# Patient Record
Sex: Female | Born: 2000 | Race: White | Hispanic: No | Marital: Single | Smoking: Never smoker
Health system: Southern US, Community
[De-identification: ages and names within clinical notes are randomized; demographics above are authoritative.]

## PROBLEM LIST (undated history)

## (undated) DIAGNOSIS — J45909 Unspecified asthma, uncomplicated: Secondary | ICD-10-CM

---
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
## 2022-02-21 ENCOUNTER — Ambulatory Visit (INDEPENDENT_AMBULATORY_CARE_PROVIDER_SITE_OTHER): Payer: Medicaid Other | Admitting: Nurse Practitioner

## 2022-02-21 ENCOUNTER — Encounter: Payer: Self-pay | Admitting: Nurse Practitioner

## 2022-02-21 VITALS — BP 127/74 | HR 73 | Temp 97.9°F | Ht 61.0 in | Wt 126.1 lb

## 2022-02-21 DIAGNOSIS — J4520 Mild intermittent asthma, uncomplicated: Secondary | ICD-10-CM | POA: Diagnosis not present

## 2022-02-21 DIAGNOSIS — Z7689 Persons encountering health services in other specified circumstances: Secondary | ICD-10-CM | POA: Diagnosis not present

## 2022-02-21 MED ORDER — QVAR REDIHALER 40 MCG/ACT IN AERB: 2.0000 | INHALATION_SPRAY | Freq: Two times a day (BID) | RESPIRATORY_TRACT | 5 refills | Status: DC

## 2022-02-21 MED ORDER — ALBUTEROL SULFATE HFA 108 (90 BASE) MCG/ACT IN AERS: 2.0000 | INHALATION_SPRAY | Freq: Four times a day (QID) | RESPIRATORY_TRACT | 2 refills | Status: DC | PRN

## 2022-02-21 NOTE — Progress Notes (Signed)
New Patient Office Visit  Subjective    Patient ID: Lisa Calhoun, female    DOB: 2001/05/30  Age: 21 y.o. MRN: [id number]  CC:  Chief Complaint  Patient presents with   New Patient (Initial Visit)    HPI Lisa Calhoun presents to establish care She is coming from her pediatrician's office.  -history of asthma. Uses Qvar twice daily and a rescue inhaler as needed.  -needs both maintenance and rescue inhalers refilled. States that it has been years since using her rescue inhaler.  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  -she denies problems or concerns today.  -had been treated for ADD in the past. Has not taken medication for this since she was in middle school.   Outpatient Encounter Medications as of 02/21/2022  Medication Sig   albuterol (VENTOLIN HFA) 108 (90 Base) MCG/ACT inhaler Inhale 2 puffs into the lungs every 6 (six) hours as needed for wheezing or shortness of breath.   beclomethasone (QVAR REDIHALER) 40 MCG/ACT inhaler Inhale 2 puffs into the lungs 2 (two) times daily.   [DISCONTINUED] albuterol (PROVENTIL HFA;VENTOLIN HFA) 108 (90 BASE) MCG/ACT inhaler Inhale 2 puffs into the lungs every 6 (six) hours as needed. For shortness of breath   [DISCONTINUED] atomoxetine (STRATTERA) 18 MG capsule Take 18 mg by mouth daily.   [DISCONTINUED] beclomethasone (QVAR) 40 MCG/ACT inhaler Inhale 2 puffs into the lungs 2 (two) times daily.   [DISCONTINUED] clindamycin (CLEOCIN) 75 MG/5ML solution Take 9 mg by mouth every 12 (twelve) hours.   [DISCONTINUED] dexmethylphenidate (FOCALIN XR) 20 MG 24 hr capsule Take 20 mg by mouth every morning.   [DISCONTINUED] dexmethylphenidate (FOCALIN) 5 MG tablet Take 5 mg by mouth daily. Take after school.   No facility-administered encounter medications on file as of 02/21/2022.    Past Medical History:  Diagnosis Date   Asthma    Attention deficit disorder     History reviewed. No  pertinent surgical history.  Family History  Problem Relation Age of Onset   Diabetes Maternal Grandmother    Diabetes Maternal Grandfather     Social History   Socioeconomic History   Marital status: Single    Spouse name: Not on file   Number of children: Not on file   Years of education: Not on file   Highest education level: Not on file  Occupational History   Not on file  Tobacco Use   Smoking status: Never   Smokeless tobacco: Not on file  Vaping Use   Vaping Use: Not on file  Substance and Sexual Activity   Alcohol use: No   Drug use: Never   Sexual activity: Yes  Other Topics Concern   Not on file  Social History Narrative   Not on file   Social Determinants of Health   Financial Resource Strain: Not on file  Food Insecurity: Not on file  Transportation Needs: Not on file  Physical Activity: Not on file  Stress: Not on file  Social Connections: Not on file  Intimate Partner Violence: Not on file    Review of Systems  Constitutional:  Negative for chills, fever and malaise/fatigue.  HENT:  Negative for congestion, sinus pain and sore throat.   Eyes: Negative.   Respiratory:  Negative for cough, shortness of breath and wheezing.        Well controlled asthma.   Cardiovascular:  Negative for chest pain, palpitations and leg swelling.  Gastrointestinal:  Negative  for constipation, diarrhea, nausea and vomiting.  Genitourinary: Negative.   Musculoskeletal:  Negative for myalgias.  Skin: Negative.   Neurological:  Negative for dizziness and headaches.  Endo/Heme/Allergies:  Does not bruise/bleed easily.  Psychiatric/Behavioral:  Negative for depression. The patient is not nervous/anxious.        Objective    Today's Vitals   02/21/22 1500  BP: 127/74  Pulse: 73  Temp: 97.9 F (36.6 C)  SpO2: 98%  Weight: 126 lb 1.9 oz (57.2 kg)  Height: 5\' 1"  (1.549 m)   Body mass index is 23.83 kg/m.   Physical Exam Vitals and nursing note reviewed.   Constitutional:      Appearance: Normal appearance. She is well-developed.  HENT:     Head: Normocephalic.  Eyes:     Pupils: Pupils are equal, round, and reactive to light.  Cardiovascular:     Rate and Rhythm: Normal rate and regular rhythm.     Pulses: Normal pulses.     Heart sounds: Normal heart sounds.  Pulmonary:     Effort: Pulmonary effort is normal.     Breath sounds: Normal breath sounds.  Abdominal:     Palpations: Abdomen is soft.  Musculoskeletal:        General: Normal range of motion.     Cervical back: Normal range of motion and neck supple.  Lymphadenopathy:     Cervical: No cervical adenopathy.  Skin:    General: Skin is warm and dry.     Capillary Refill: Capillary refill takes less than 2 seconds.  Neurological:     General: No focal deficit present.     Mental Status: She is alert and oriented to person, place, and time.  Psychiatric:        Mood and Affect: Mood normal.        Behavior: Behavior normal.        Thought Content: Thought content normal.        Judgment: Judgment normal.       Assessment & Plan:  1. Mild intermittent asthma without complication Continue Qvar twice daily. Use rescue inhaler as needed and as prescribed. New prescriptions were sent to her pharmacy today.  - albuterol (VENTOLIN HFA) 108 (90 Base) MCG/ACT inhaler; Inhale 2 puffs into the lungs every 6 (six) hours as needed for wheezing or shortness of breath.  Dispense: 1 each; Refill: 2 - beclomethasone (QVAR REDIHALER) 40 MCG/ACT inhaler; Inhale 2 puffs into the lungs 2 (two) times daily.  Dispense: 1 each; Refill: 5  2. Encounter to establish care Appointment today to establish new primary care provider      Problem List Items Addressed This Visit       Respiratory   Mild intermittent asthma without complication - Primary   Relevant Medications   albuterol (VENTOLIN HFA) 108 (90 Base) MCG/ACT inhaler   beclomethasone (QVAR REDIHALER) 40 MCG/ACT inhaler   Other  Visit Diagnoses     Encounter to establish care           Return in about 6 months (around 08/23/2022) for health maintenance exam.   14/09/2021, NP

## 2022-02-24 DIAGNOSIS — J4520 Mild intermittent asthma, uncomplicated: Secondary | ICD-10-CM | POA: Insufficient documentation

## 2022-05-02 ENCOUNTER — Other Ambulatory Visit: Payer: Self-pay | Admitting: Nurse Practitioner

## 2022-05-02 DIAGNOSIS — J4520 Mild intermittent asthma, uncomplicated: Secondary | ICD-10-CM

## 2022-08-21 ENCOUNTER — Other Ambulatory Visit: Payer: Self-pay | Admitting: Nurse Practitioner

## 2022-08-21 DIAGNOSIS — J4520 Mild intermittent asthma, uncomplicated: Secondary | ICD-10-CM

## 2022-08-21 MED ORDER — FLUTICASONE PROPIONATE HFA 44 MCG/ACT IN AERO: 2.0000 | INHALATION_SPRAY | Freq: Two times a day (BID) | RESPIRATORY_TRACT | 11 refills | Status: DC

## 2022-08-23 ENCOUNTER — Ambulatory Visit (INDEPENDENT_AMBULATORY_CARE_PROVIDER_SITE_OTHER): Payer: Medicaid Other | Admitting: Nurse Practitioner

## 2022-08-23 ENCOUNTER — Encounter: Payer: Self-pay | Admitting: Nurse Practitioner

## 2022-08-23 VITALS — BP 112/80 | HR 65 | Resp 20 | Ht 61.0 in | Wt 135.0 lb

## 2022-08-23 DIAGNOSIS — Z0000 Encounter for general adult medical examination without abnormal findings: Secondary | ICD-10-CM

## 2022-08-23 DIAGNOSIS — J4520 Mild intermittent asthma, uncomplicated: Secondary | ICD-10-CM

## 2022-08-23 DIAGNOSIS — L309 Dermatitis, unspecified: Secondary | ICD-10-CM | POA: Diagnosis not present

## 2022-08-23 MED ORDER — EUCRISA 2 % EX OINT: TOPICAL_OINTMENT | CUTANEOUS | 3 refills | Status: DC

## 2022-08-23 MED ORDER — FLUTICASONE PROPIONATE HFA 44 MCG/ACT IN AERO: 2.0000 | INHALATION_SPRAY | Freq: Two times a day (BID) | RESPIRATORY_TRACT | 11 refills | Status: DC

## 2022-08-23 MED ORDER — ALBUTEROL SULFATE HFA 108 (90 BASE) MCG/ACT IN AERS: 2.0000 | INHALATION_SPRAY | Freq: Four times a day (QID) | RESPIRATORY_TRACT | 3 refills | Status: DC | PRN

## 2022-08-23 MED ORDER — MONTELUKAST SODIUM 10 MG PO TABS: 10.0000 mg | ORAL_TABLET | Freq: Every day | ORAL | 1 refills | Status: DC

## 2022-08-23 NOTE — Progress Notes (Signed)
Complete physical exam   Patient: Lisa Calhoun   DOB: [birth year]   21 y.o. Female  MRN: [id number] Visit Date: 08/23/2022    Chief Complaint  Patient presents with   Annual Exam    Non Fasting    Subjective    Lisa Calhoun is a 21 y.o. female who presents today for a complete physical exam.  She reports consuming a  generally healthy  diet.  She generally feels well. She does not have additional problems to discuss today.  HPI HPI     Annual Exam    Additional comments: Non Fasting       Last edited by Gemma Payor, CMA on 08/23/2022 10:19 AM.        Past Medical History:  Diagnosis Date   Asthma    Attention deficit disorder    History reviewed. No pertinent surgical history. Social History   Socioeconomic History   Marital status: Single    Spouse name: Not on file   Number of children: Not on file   Years of education: Not on file   Highest education level: Not on file  Occupational History   Not on file  Tobacco Use   Smoking status: Never   Smokeless tobacco: Not on file  Vaping Use   Vaping Use: Not on file  Substance and Sexual Activity   Alcohol use: No   Drug use: Never   Sexual activity: Yes  Other Topics Concern   Not on file  Social History Narrative   Not on file   Social Determinants of Health   Financial Resource Strain: Not on file  Food Insecurity: Not on file  Transportation Needs: Not on file  Physical Activity: Not on file  Stress: Not on file  Social Connections: Not on file  Intimate Partner Violence: Not on file   Family Status  Relation Name Status   MGM  (Not Specified)   MGF  (Not Specified)   Family History  Problem Relation Age of Onset   Diabetes Maternal Grandmother    Diabetes Maternal Grandfather    Allergies  Allergen Reactions   Sulfamethoxazole-Trimethoprim Itching    Patient Care Team: Ronnell Freshwater, NP as PCP - General (Family Medicine)   Medications: Outpatient Medications Prior  to Visit  Medication Sig   [DISCONTINUED] fluticasone (FLOVENT HFA) 44 MCG/ACT inhaler Inhale 2 puffs into the lungs 2 (two) times daily.   [DISCONTINUED] VENTOLIN HFA 108 (90 Base) MCG/ACT inhaler TAKE 2 PUFFS BY MOUTH EVERY 6 HOURS AS NEEDED FOR WHEEZE OR SHORTNESS OF BREATH   No facility-administered medications prior to visit.    Review of Systems  Constitutional:  Negative for activity change, appetite change, chills, fatigue and fever.  HENT:  Negative for congestion, postnasal drip, rhinorrhea, sinus pressure, sinus pain, sneezing and sore throat.   Eyes: Negative.   Respiratory:  Negative for cough, chest tightness, shortness of breath and wheezing.   Cardiovascular:  Negative for chest pain and palpitations.  Gastrointestinal:  Negative for abdominal pain, constipation, diarrhea, nausea and vomiting.  Endocrine: Negative for cold intolerance, heat intolerance, polydipsia and polyuria.  Genitourinary:  Negative for dyspareunia, dysuria, flank pain, frequency and urgency.  Musculoskeletal:  Negative for arthralgias, back pain and myalgias.  Skin:  Negative for rash.  Allergic/Immunologic: Positive for environmental allergies.  Neurological:  Negative for dizziness, weakness and headaches.  Hematological:  Negative for adenopathy.  Psychiatric/Behavioral:  The patient is not nervous/anxious.       Objective  Today's Vitals   08/23/22 1020  BP: 112/80  Pulse: 65  Resp: 20  SpO2: 95%  Weight: 135 lb (61.2 kg)  Height: _0  (1.549 m)   Body mass index is 25.51 kg/m.  BP Readings from Last 3 Encounters:  08/23/22 112/80  02/21/22 127/74  03/20/15 94/74    Wt Readings from Last 3 Encounters:  08/23/22 135 lb (61.2 kg) (62 %, Z= 0.31)*  02/21/22 126 lb 1.9 oz (57.2 kg) (49 %, Z= -0.03)*  03/20/15 89 lb 6.4 oz (40.6 kg) (40 %, Z= -0.26)*   * Growth percentiles are based on CDC (Girls, 2-20 Years) data.     Physical Exam Vitals and nursing note reviewed.   Constitutional:      Appearance: Normal appearance. She is well-developed.  HENT:     Head: Normocephalic and atraumatic.     Right Ear: Tympanic membrane, ear canal and external ear normal.     Left Ear: Tympanic membrane, ear canal and external ear normal.     Nose: Nose normal.     Mouth/Throat:     Mouth: Mucous membranes are moist.     Pharynx: Oropharynx is clear.  Eyes:     Extraocular Movements: Extraocular movements intact.     Conjunctiva/sclera: Conjunctivae normal.     Pupils: Pupils are equal, round, and reactive to light.  Cardiovascular:     Rate and Rhythm: Normal rate and regular rhythm.     Pulses: Normal pulses.     Heart sounds: Normal heart sounds.  Pulmonary:     Effort: Pulmonary effort is normal.     Breath sounds: Normal breath sounds.  Abdominal:     General: Bowel sounds are normal. There is no distension.     Palpations: Abdomen is soft. There is no mass.     Tenderness: There is no abdominal tenderness. There is no right CVA tenderness, left CVA tenderness, guarding or rebound.     Hernia: No hernia is present.  Musculoskeletal:        General: Normal range of motion.     Cervical back: Normal range of motion and neck supple.  Lymphadenopathy:     Cervical: No cervical adenopathy.  Skin:    General: Skin is warm and dry.     Capillary Refill: Capillary refill takes less than 2 seconds.  Neurological:     General: No focal deficit present.     Mental Status: She is alert and oriented to person, place, and time.  Psychiatric:        Mood and Affect: Mood normal.        Behavior: Behavior normal.        Thought Content: Thought content normal.        Judgment: Judgment normal.      Last depression screening scores   Row Labels 08/23/2022   10:37 AM 02/21/2022    3:32 PM  PHQ 2/9 Scores   Section Header. No data exists in this row.    PHQ - 2 Score   4 0  PHQ- 9 Score   10 3   Last fall risk screening   Row Labels 08/23/2022   10:38 AM   Fall Risk    Section Header. No data exists in this row.   Falls in the past year?   0  Number falls in past yr:   0  Injury with Fall?   0        Assessment & Plan  1. Routine general medical examination at a health care facility Annual physical today   2. Mild intermittent asthma without complication Continue maintenance inhaler and singulair every day. Use rescue inhaler as needed and as prescribed. Refills provided for all today  - fluticasone (FLOVENT HFA) 44 MCG/ACT inhaler; Inhale 2 puffs into the lungs 2 (two) times daily.  Dispense: 1 each; Refill: 11 - albuterol (VENTOLIN HFA) 108 (90 Base) MCG/ACT inhaler; Inhale 2 puffs into the lungs every 6 (six) hours as needed for wheezing or shortness of breath.  Dispense: 18 each; Refill: 3 - montelukast (SINGULAIR) 10 MG tablet; Take 1 tablet (10 mg total) by mouth at bedtime.  Dispense: 90 tablet; Refill: 1  3. Eczema of face Trial Eucrisa ointment - use on effected areas of the  face and body as needed and as prescribed  - Crisaborole (EUCRISA) 2 % OINT; Apply small amount to effected areas twice daily as needed  Dispense: 60 g; Refill: 3    Immunization History  Administered Date(s) Administered   DTaP 03/03/2003, 05/04/2003, 06/29/2003, 08/03/2004, 02/05/2007   HIB (PRP-T) 03/03/2003, 05/04/2003, 06/29/2003, 12/22/2003   HPV 9-valent 03/17/2017   HPV Quadrivalent 12/12/2014   Hepatitis A, Ped/Adol-2 Dose 02/03/2006, 02/05/2007   Hepatitis B, PED/ADOLESCENT 12/23/2002, 01/25/2003, 09/26/2003   IPV 03/03/2003, 05/04/2003, 06/29/2003, 02/05/2007   Influenza Split 07/28/2015   Influenza,inj,Quad PF,6+ Mos 08/08/2016, 08/13/2021   MMR 12/22/2003, 02/05/2007   Meningococcal Conjugate 09/21/2014   Meningococcal Mcv4,unspecified 08/13/2021   Pneumococcal Conjugate PCV 7 03/03/2003, 05/04/2003, 06/29/2003, 08/03/2004   Tdap 04/26/2013   Varicella 12/22/2003, 02/05/2007    Health Maintenance  Topic Date Due   CHLAMYDIA  SCREENING  Never done   HIV Screening  Never done   Hepatitis C Screening  Never done   INFLUENZA VACCINE  04/23/2022   DTaP/Tdap/Td (7 - Td or Tdap) 04/27/2023   HPV VACCINES  Completed    Discussed health benefits of physical activity, and encouraged her to engage in regular exercise appropriate for her age and condition.  Problem List Items Addressed This Visit       Respiratory   Mild intermittent asthma without complication   Relevant Medications   fluticasone (FLOVENT HFA) 44 MCG/ACT inhaler   albuterol (VENTOLIN HFA) 108 (90 Base) MCG/ACT inhaler   montelukast (SINGULAIR) 10 MG tablet     Musculoskeletal and Integument   Eczema of face   Relevant Medications   Crisaborole (EUCRISA) 2 % OINT   Other Visit Diagnoses     Routine general medical examination at a health care facility    -  Primary        Return in about 3 months (around 11/22/2022) for asthma.        Ronnell Freshwater, NP  [facility] at [facility] [phone] (phone) [phone] (fax)  [place]

## 2022-08-26 ENCOUNTER — Telehealth: Payer: Self-pay

## 2022-08-26 NOTE — Telephone Encounter (Signed)
Eucrisa 2% ointment was approved by [employer] from 08/26/2022 to 08/26/23. Pharmacy has been notified.

## 2022-09-14 DIAGNOSIS — L309 Dermatitis, unspecified: Secondary | ICD-10-CM | POA: Insufficient documentation

## 2022-11-24 NOTE — Progress Notes (Unsigned)
Established patient visit   Patient: Lisa Calhoun   DOB: 2000-11-16   22 y.o. Female  MRN: [id number] Visit Date: 11/25/2022   No chief complaint on file.  Subjective    HPI  Follow up  -asthma   --currently on flovent twice daily, singulair daily, and albuterol rescue  inhaler  Medications: Outpatient Medications Prior to Visit  Medication Sig   albuterol (VENTOLIN HFA) 108 (90 Base) MCG/ACT inhaler Inhale 2 puffs into the lungs every 6 (six) hours as needed for wheezing or shortness of breath.   Crisaborole (EUCRISA) 2 % OINT Apply small amount to effected areas twice daily as needed   fluticasone (FLOVENT HFA) 44 MCG/ACT inhaler Inhale 2 puffs into the lungs 2 (two) times daily.   montelukast (SINGULAIR) 10 MG tablet Take 1 tablet (10 mg total) by mouth at bedtime.   No facility-administered medications prior to visit.    Review of Systems  {Labs (Optional):23779}   Objective    There were no vitals filed for this visit. There is no height or weight on file to calculate BMI.  BP Readings from Last 3 Encounters:  08/23/22 112/80  02/21/22 127/74  03/20/15 94/74    Wt Readings from Last 3 Encounters:  08/23/22 135 lb (61.2 kg) (62 %, Z= 0.31)*  02/21/22 126 lb 1.9 oz (57.2 kg) (49 %, Z= -0.03)*  03/20/15 89 lb 6.4 oz (40.6 kg) (40 %, Z= -0.26)*   * Growth percentiles are based on CDC (Girls, 2-20 Years) data.    Physical Exam  ***  No results found for any visits on 11/25/22.  Assessment & Plan     Problem List Items Addressed This Visit   None    No follow-ups on file.         Ronnell Freshwater, NP  [facility] at [facility] [phone] (phone) [phone] (fax)  [place]

## 2022-11-25 ENCOUNTER — Encounter: Payer: Self-pay | Admitting: Nurse Practitioner

## 2022-11-25 ENCOUNTER — Ambulatory Visit (INDEPENDENT_AMBULATORY_CARE_PROVIDER_SITE_OTHER): Payer: Medicaid Other | Admitting: Nurse Practitioner

## 2022-11-25 VITALS — BP 99/65 | HR 94 | Ht 61.1 in | Wt 137.0 lb

## 2022-11-25 DIAGNOSIS — J4520 Mild intermittent asthma, uncomplicated: Secondary | ICD-10-CM

## 2022-11-25 DIAGNOSIS — R41840 Attention and concentration deficit: Secondary | ICD-10-CM

## 2022-11-25 DIAGNOSIS — F902 Attention-deficit hyperactivity disorder, combined type: Secondary | ICD-10-CM | POA: Insufficient documentation

## 2022-11-25 MED ORDER — DEXMETHYLPHENIDATE HCL ER 10 MG PO CP24: 10.0000 mg | ORAL_CAPSULE | Freq: Every day | ORAL | 0 refills | Status: DC | PRN

## 2022-12-25 ENCOUNTER — Encounter: Payer: Self-pay | Admitting: Nurse Practitioner

## 2022-12-25 ENCOUNTER — Ambulatory Visit (INDEPENDENT_AMBULATORY_CARE_PROVIDER_SITE_OTHER): Payer: Medicaid Other | Admitting: Nurse Practitioner

## 2022-12-25 VITALS — BP 98/64 | HR 81 | Ht 62.0 in | Wt 136.8 lb

## 2022-12-25 DIAGNOSIS — J4520 Mild intermittent asthma, uncomplicated: Secondary | ICD-10-CM

## 2022-12-25 DIAGNOSIS — R41840 Attention and concentration deficit: Secondary | ICD-10-CM | POA: Diagnosis not present

## 2022-12-25 MED ORDER — DEXMETHYLPHENIDATE HCL ER 10 MG PO CP24: 10.0000 mg | ORAL_CAPSULE | Freq: Every day | ORAL | 0 refills | Status: DC | PRN

## 2022-12-25 MED ORDER — PRO COMFORT SPACER ADULT MISC: 5 refills | Status: DC

## 2022-12-25 NOTE — Progress Notes (Signed)
Established patient visit   Patient: Lisa Calhoun   DOB: 11/05/2000   22 y.o. 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   Asthma is doing better  -using flovent twice daily, albuterol rescue inhaler, and singulair.  -using nebulizer saline treatments daily. Feels like she is doing better since the addition of nebulizer  Would like to have spacer for her inhaler.   -She denies chest pain, chest pressure, or shortness of breath. She denies headaches or visual disturbances. She denies abdominal pain, nausea, vomiting, or changes in bowel or bladder habits.     Medications: Outpatient Medications Prior to Visit  Medication Sig   Crisaborole (EUCRISA) 2 % OINT Apply small amount to effected areas twice daily as needed   montelukast (SINGULAIR) 10 MG tablet Take 1 tablet (10 mg total) by mouth at bedtime.   [DISCONTINUED] albuterol (VENTOLIN HFA) 108 (90 Base) MCG/ACT inhaler Inhale 2 puffs into the lungs every 6 (six) hours as needed for wheezing or shortness of breath.   [DISCONTINUED] dexmethylphenidate (FOCALIN XR) 10 MG 24 hr capsule Take 1 capsule (10 mg total) by mouth daily as needed.   fluticasone (FLOVENT HFA) 44 MCG/ACT inhaler Inhale 2 puffs into the lungs 2 (two) times daily.   No facility-administered medications prior to visit.    Review of Systems See HPI      Objective     Today's Vitals   12/25/22 1435  BP: 98/64  Pulse: 81  SpO2: 97%  Weight: 136 lb 12 oz (62 kg)  Height: 5\' 2"  (1.575 m)  PainSc: 3   PainLoc: Throat   Body mass index is 25.01 kg/m.  BP Readings from Last 3 Encounters:  12/25/22 98/64  11/25/22 99/65   08/23/22 112/80    Wt Readings from Last 3 Encounters:  12/25/22 136 lb 12 oz (62 kg)  11/25/22 137 lb (62.1 kg) (65 %, Z= 0.38)*  08/23/22 135 lb (61.2 kg) (62 %, Z= 0.31)*   * Growth percentiles are based on CDC (Girls, 2-20 Years) data.    Physical Exam Vitals and nursing note reviewed.  Constitutional:      Appearance: Normal appearance. She is well-developed.  HENT:     Head: Normocephalic and atraumatic.     Nose: Nose normal.     Mouth/Throat:     Mouth: Mucous membranes are moist.     Pharynx: Oropharynx is clear.  Eyes:     Extraocular Movements: Extraocular movements intact.     Conjunctiva/sclera: Conjunctivae normal.     Pupils: Pupils are equal, round, and reactive to light.  Neck:     Vascular: No carotid bruit.  Cardiovascular:     Rate and Rhythm: Normal rate and regular rhythm.     Pulses: Normal pulses.     Heart sounds: Normal heart sounds.  Pulmonary:     Effort: Pulmonary effort is normal.     Breath sounds: Normal breath sounds.  Abdominal:     Palpations: Abdomen is soft.  Musculoskeletal:        General: Normal range of motion.     Cervical back: Normal range of  motion and neck supple.  Lymphadenopathy:     Cervical: No cervical adenopathy.  Skin:    General: Skin is warm and dry.     Capillary Refill: Capillary refill takes less than 2 seconds.  Neurological:     General: No focal deficit present.     Mental Status: She is alert and oriented to person, place, and time.  Psychiatric:        Mood and Affect: Mood normal.        Behavior: Behavior normal.        Thought Content: Thought content normal.        Judgment: Judgment normal.      Assessment & Plan    Mild intermittent asthma without complication Assessment & Plan: Improved and stable. Continue current medication.   Orders: -     Pro Comfort Spacer Adult; To use with rescue inhaler prn  Dispense: 1 each; Refill: 5  Concentration deficit Assessment & Plan: Well controlled  on focalin XR 10 mg daily.  -three 30 day prescriptions were sent to her pharmacy today   Orders: -     Dexmethylphenidate HCl ER; Take 1 capsule (10 mg total) by mouth daily as needed.  Dispense: 30 capsule; Refill: 0     Return in about 3 months (around 03/26/2023) for mood, ADD.         Carlean Jews, NP  [facility] [phone] (phone) [phone] (fax)  [facility]

## 2023-01-03 ENCOUNTER — Other Ambulatory Visit: Payer: Self-pay | Admitting: Nurse Practitioner

## 2023-01-03 DIAGNOSIS — J4520 Mild intermittent asthma, uncomplicated: Secondary | ICD-10-CM

## 2023-01-24 NOTE — Assessment & Plan Note (Signed)
Well controlled on focalin XR 10 mg daily.  -three 30 day prescriptions were sent to her pharmacy today

## 2023-01-24 NOTE — Assessment & Plan Note (Signed)
Improved and stable. Continue current medication.  

## 2023-02-06 ENCOUNTER — Other Ambulatory Visit: Payer: Self-pay | Admitting: Nurse Practitioner

## 2023-02-06 DIAGNOSIS — J4520 Mild intermittent asthma, uncomplicated: Secondary | ICD-10-CM

## 2023-03-13 ENCOUNTER — Other Ambulatory Visit: Payer: Self-pay | Admitting: Nurse Practitioner

## 2023-03-13 DIAGNOSIS — J4520 Mild intermittent asthma, uncomplicated: Secondary | ICD-10-CM

## 2023-03-26 ENCOUNTER — Ambulatory Visit: Payer: Medicaid Other | Admitting: Family Medicine

## 2023-03-26 ENCOUNTER — Other Ambulatory Visit: Payer: Self-pay | Admitting: Nurse Practitioner

## 2023-03-26 DIAGNOSIS — R41840 Attention and concentration deficit: Secondary | ICD-10-CM

## 2023-03-26 DIAGNOSIS — J4520 Mild intermittent asthma, uncomplicated: Secondary | ICD-10-CM

## 2023-03-26 MED ORDER — FLUTICASONE PROPIONATE HFA 44 MCG/ACT IN AERO: 2.0000 | INHALATION_SPRAY | Freq: Two times a day (BID) | RESPIRATORY_TRACT | 11 refills | Status: DC

## 2023-03-26 MED ORDER — DEXMETHYLPHENIDATE HCL ER 10 MG PO CP24: 10.0000 mg | ORAL_CAPSULE | Freq: Every day | ORAL | 0 refills | Status: DC | PRN

## 2023-03-27 ENCOUNTER — Other Ambulatory Visit: Payer: Self-pay | Admitting: Nurse Practitioner

## 2023-03-27 DIAGNOSIS — J4520 Mild intermittent asthma, uncomplicated: Secondary | ICD-10-CM

## 2023-03-28 MED ORDER — ALBUTEROL SULFATE HFA 108 (90 BASE) MCG/ACT IN AERS: 2.0000 | INHALATION_SPRAY | Freq: Four times a day (QID) | RESPIRATORY_TRACT | 1 refills | Status: DC | PRN

## 2023-04-20 ENCOUNTER — Other Ambulatory Visit: Payer: Self-pay | Admitting: Family Medicine

## 2023-04-20 DIAGNOSIS — R41840 Attention and concentration deficit: Secondary | ICD-10-CM

## 2023-04-25 MED ORDER — DEXMETHYLPHENIDATE HCL ER 10 MG PO CP24: 10.0000 mg | ORAL_CAPSULE | Freq: Every day | ORAL | 0 refills | Status: DC | PRN

## 2023-04-25 NOTE — Telephone Encounter (Signed)
1 month refill sent to get her through her appointment at the end of August which was delayed due to staffing shortage

## 2023-05-20 ENCOUNTER — Encounter: Payer: Self-pay | Admitting: Family Medicine

## 2023-05-20 ENCOUNTER — Ambulatory Visit (INDEPENDENT_AMBULATORY_CARE_PROVIDER_SITE_OTHER): Payer: Medicaid Other | Admitting: Family Medicine

## 2023-05-20 VITALS — BP 119/85 | HR 84 | Resp 18 | Ht 62.0 in | Wt 146.0 lb

## 2023-05-20 DIAGNOSIS — F902 Attention-deficit hyperactivity disorder, combined type: Secondary | ICD-10-CM | POA: Diagnosis not present

## 2023-05-20 MED ORDER — METHYLPHENIDATE HCL ER (LA) 10 MG PO CP24: 10.0000 mg | ORAL_CAPSULE | ORAL | 0 refills | Status: DC

## 2023-05-20 NOTE — Assessment & Plan Note (Addendum)
On 11/25/2022, patient scored 3/6 on Part A of adult ASRS 1.1. She scored 7/11 on Part B. She has previous diagnosis with treatment of ADHD as a child and early adolescent.  Dexmethylphenidate (Focalin XR) 10 mg daily was helpful, but she is running into issues with shortages.  We discussed options including calling different pharmacies to see if there is better availability versus switching to a different medication.  Patient would like to switch to another medication at this point.  Switching to methylphenidate (Ritalin LA) 10 mg once daily.  If insurance does not cover this, will send in for Focalin and she will call different pharmacies to see if they have it in stock, or we will try one of the [facility] health pharmacies.  Patient verbalized understanding and is agreeable to this plan.

## 2023-05-20 NOTE — Patient Instructions (Signed)
We will try for the generic version of another one called Ritalin. The active ingredient is similar to the active ingredient of Focalin, so I am hopeful that it will still be helpful without the supply shortage issues!

## 2023-05-20 NOTE — Progress Notes (Signed)
   Established Patient Office Visit  Subjective   Patient ID: Rayleen Serratos, female    DOB: 01/08/2001  Age: 22 y.o. MRN: [id number]  Chief Complaint  Patient presents with   ADD    HPI Vinessa Schlaefer is a 22 y.o. female presenting today for follow up of ADHD. ADHD: Reports symptoms were well controlled on dexmethylphenidate 10 mg daily, but she has been having issues getting it pharmacy due to shortages. Denies any problems with insomnia, chest pain, palpitations, or SOB.    ROS Negative unless otherwise noted in HPI   Objective:     BP 119/85 (BP Location: Left Arm, Patient Position: Sitting, Cuff Size: Normal)   Pulse 84   Resp 18   Ht 5\' 2"  (1.575 m)   Wt 146 lb (66.2 kg)   SpO2 97%   BMI 26.70 kg/m   Physical Exam Constitutional:      General: She is not in acute distress.    Appearance: Normal appearance.  HENT:     Head: Normocephalic and atraumatic.  Cardiovascular:     Rate and Rhythm: Normal rate and regular rhythm.     Heart sounds: No murmur heard.    No friction rub. No gallop.  Pulmonary:     Effort: Pulmonary effort is normal. No respiratory distress.     Breath sounds: No wheezing, rhonchi or rales.  Skin:    General: Skin is warm and dry.  Neurological:     Mental Status: She is alert and oriented to person, place, and time.     Assessment & Plan:  ADHD (attention deficit hyperactivity disorder), combined type Assessment & Plan: On 11/25/2022, patient scored 3/6 on Part A of adult ASRS 1.1. She scored 7/11 on Part B. She has previous diagnosis with treatment of ADHD as a child and early adolescent.  Dexmethylphenidate (Focalin XR) 10 mg daily was helpful, but she is running into issues with shortages.  We discussed options including calling different pharmacies to see if there is better availability versus switching to a different medication.  Patient would like to switch to another medication at this point.  Switching to methylphenidate (Ritalin  LA) 10 mg once daily.  If insurance does not cover this, will send in for Focalin and she will call different pharmacies to see if they have it in stock, or we will try one of the [facility] health pharmacies.  Patient verbalized understanding and is agreeable to this plan.  Orders: -     Methylphenidate HCl ER (LA); Take 1 capsule (10 mg total) by mouth every morning.  Dispense: 30 capsule; Refill: 0    Return in about 3 months (around 08/20/2023) for follow-up for ADHD, in person or video.    Melida Quitter, PA

## 2023-05-27 ENCOUNTER — Other Ambulatory Visit: Payer: Self-pay | Admitting: Nurse Practitioner

## 2023-05-27 ENCOUNTER — Other Ambulatory Visit: Payer: Self-pay | Admitting: Family Medicine

## 2023-05-27 DIAGNOSIS — J4520 Mild intermittent asthma, uncomplicated: Secondary | ICD-10-CM

## 2023-05-27 MED ORDER — MONTELUKAST SODIUM 10 MG PO TABS: 10.0000 mg | ORAL_TABLET | Freq: Every day | ORAL | 1 refills | Status: DC

## 2023-06-19 ENCOUNTER — Other Ambulatory Visit: Payer: Self-pay | Admitting: Family Medicine

## 2023-06-19 DIAGNOSIS — J4520 Mild intermittent asthma, uncomplicated: Secondary | ICD-10-CM

## 2023-06-27 ENCOUNTER — Encounter: Payer: Self-pay | Admitting: Family Medicine

## 2023-06-27 ENCOUNTER — Ambulatory Visit (INDEPENDENT_AMBULATORY_CARE_PROVIDER_SITE_OTHER): Payer: Medicaid Other | Admitting: Family Medicine

## 2023-06-27 VITALS — BP 104/73 | HR 73 | Resp 18 | Ht 62.0 in | Wt 148.0 lb

## 2023-06-27 DIAGNOSIS — L309 Dermatitis, unspecified: Secondary | ICD-10-CM

## 2023-06-27 DIAGNOSIS — F902 Attention-deficit hyperactivity disorder, combined type: Secondary | ICD-10-CM

## 2023-06-27 DIAGNOSIS — J4520 Mild intermittent asthma, uncomplicated: Secondary | ICD-10-CM

## 2023-06-27 MED ORDER — AIRSUPRA 90-80 MCG/ACT IN AERO: 2.0000 | INHALATION_SPRAY | Freq: Four times a day (QID) | RESPIRATORY_TRACT | 11 refills | Status: DC | PRN

## 2023-06-27 MED ORDER — METHYLPHENIDATE HCL ER (LA) 10 MG PO CP24: 10.0000 mg | ORAL_CAPSULE | ORAL | 0 refills | Status: DC

## 2023-06-27 MED ORDER — MONTELUKAST SODIUM 10 MG PO TABS: 10.0000 mg | ORAL_TABLET | Freq: Every day | ORAL | 1 refills | Status: DC

## 2023-06-27 NOTE — Patient Instructions (Signed)
Your new inhaler should be covered by insurance, but if not there is a savings card.  If we did not have any at the front office, you can register at their website!

## 2023-06-27 NOTE — Progress Notes (Signed)
Established Patient Office Visit  Subjective   Patient ID: Lisa Calhoun, female    DOB: 03/25/2001  Age: 22 y.o. MRN: [id number]  Chief Complaint  Patient presents with   Asthma   Eczema    HPI Lisa Calhoun is a 22 y.o. female presenting today for follow up of asthma, eczema. Asthma: She has previously been evaluated here for asthma and presents for an asthma follow-up; she is not currently in exacerbation. Symptoms currently include wheezing and occur  2-3 times a week .  Current limitations in activity from asthma: none.  Number of days of school or work missed in the last month: 0. Number of Emergency Department visits in the previous month: none. Frequency of use of quick-relief meds: 2-3 times weekly. The patient reports adherence to this regimen.  Eczema: Previously using crisaborole 2% ointment but found an over-the-counter lotion called Renew on [employer] that has been working well for her.  Outpatient Medications Prior to Visit  Medication Sig   methylphenidate (RITALIN LA) 10 MG 24 hr capsule Take 1 capsule (10 mg total) by mouth every morning.   Spacer/Aero-Holding Chambers (PRO COMFORT SPACER ADULT) MISC To use with rescue inhaler prn   VENTOLIN HFA 108 (90 Base) MCG/ACT inhaler INHALE 2 PUFFS INTO THE LUNGS EVERY 6 HOURS AS NEEDED FOR WHEEZING OR SHORTNESS OF BREATH   [DISCONTINUED] montelukast (SINGULAIR) 10 MG tablet Take 1 tablet (10 mg total) by mouth at bedtime.   fluticasone (FLOVENT HFA) 44 MCG/ACT inhaler Inhale 2 puffs into the lungs 2 (two) times daily.   [DISCONTINUED] Crisaborole (EUCRISA) 2 % OINT Apply small amount to effected areas twice daily as needed (Patient not taking: Reported on 06/27/2023)   No facility-administered medications prior to visit.    ROS Negative unless otherwise noted in HPI   Objective:     BP 104/73 (BP Location: Left Arm, Patient Position: Sitting, Cuff Size: Normal)   Pulse 73   Resp 18   Ht 5\' 2"  (1.575 m)   Wt 148 lb  (67.1 kg)   SpO2 96%   BMI 27.07 kg/m   Physical Exam Constitutional:      General: She is not in acute distress.    Appearance: Normal appearance.  HENT:     Head: Normocephalic and atraumatic.  Cardiovascular:     Rate and Rhythm: Normal rate and regular rhythm.     Heart sounds: No murmur heard.    No friction rub. No gallop.  Pulmonary:     Effort: Pulmonary effort is normal. No respiratory distress.     Breath sounds: No wheezing, rhonchi or rales.  Skin:    General: Skin is warm and dry.  Neurological:     Mental Status: She is alert and oriented to person, place, and time.     Assessment & Plan:  Mild intermittent asthma without complication Assessment & Plan: In accordance with GINA guidelines, replacing albuterol only inhaler with combination albuterol-budesonide Airsupra rescue inhaler.  Discussed mechanism of action and how to use including the importance of rinse and spit after use.  Patient verbalized understanding and is agreeable to this plan.  Orders: -     Airsupra; Inhale 2 puffs into the lungs every 6 (six) hours as needed.  Dispense: 1 g; Refill: 11 -     Montelukast Sodium; Take 1 tablet (10 mg total) by mouth at bedtime.  Dispense: 90 tablet; Refill: 1  Eczema, unspecified type Assessment & Plan: Continue with over-the-counter lotion for eczema.  Will continue to monitor.     Return in about 3 months (around 09/27/2023) for annual physical.    Melida Quitter, PA

## 2023-06-27 NOTE — Assessment & Plan Note (Signed)
In accordance with GINA guidelines, replacing albuterol only inhaler with combination albuterol-budesonide Airsupra rescue inhaler.  Discussed mechanism of action and how to use including the importance of rinse and spit after use.  Patient verbalized understanding and is agreeable to this plan.

## 2023-06-27 NOTE — Assessment & Plan Note (Signed)
Continue with over-the-counter lotion for eczema.  Will continue to monitor.

## 2023-06-30 ENCOUNTER — Telehealth: Payer: Self-pay

## 2023-06-30 NOTE — Telephone Encounter (Signed)
Please initiate a prior authorization if we need to, Lisa Calhoun is indicated as a rescue medication for asthma.  Advair, Dulera, and Symbicort are not to rescue inhalers.

## 2023-06-30 NOTE — Telephone Encounter (Signed)
Lisa Calhoun is not a preferred formulary. The preferred formularies are Advair (Diskus & HFA), Dulara, & Symbicort.

## 2023-07-01 ENCOUNTER — Other Ambulatory Visit: Payer: Self-pay | Admitting: Family Medicine

## 2023-07-01 DIAGNOSIS — J4520 Mild intermittent asthma, uncomplicated: Secondary | ICD-10-CM

## 2023-07-01 DIAGNOSIS — J4531 Mild persistent asthma with (acute) exacerbation: Secondary | ICD-10-CM

## 2023-07-01 DIAGNOSIS — R062 Wheezing: Secondary | ICD-10-CM

## 2023-07-01 MED ORDER — AIRSUPRA 90-80 MCG/ACT IN AERO: 2.0000 | INHALATION_SPRAY | Freq: Four times a day (QID) | RESPIRATORY_TRACT | 11 refills | Status: DC | PRN

## 2023-07-03 ENCOUNTER — Other Ambulatory Visit: Payer: Self-pay | Admitting: Family Medicine

## 2023-07-03 DIAGNOSIS — J4520 Mild intermittent asthma, uncomplicated: Secondary | ICD-10-CM

## 2023-07-04 MED ORDER — FLUTICASONE PROPIONATE HFA 44 MCG/ACT IN AERO: 2.0000 | INHALATION_SPRAY | Freq: Two times a day (BID) | RESPIRATORY_TRACT | 11 refills | Status: DC

## 2023-07-25 ENCOUNTER — Ambulatory Visit (INDEPENDENT_AMBULATORY_CARE_PROVIDER_SITE_OTHER): Payer: Medicaid Other | Admitting: Family Medicine

## 2023-07-25 ENCOUNTER — Encounter: Payer: Self-pay | Admitting: Family Medicine

## 2023-07-25 VITALS — BP 118/83 | HR 69 | Ht 62.0 in | Wt 149.0 lb

## 2023-07-25 DIAGNOSIS — N926 Irregular menstruation, unspecified: Secondary | ICD-10-CM | POA: Insufficient documentation

## 2023-07-25 DIAGNOSIS — Z3009 Encounter for other general counseling and advice on contraception: Secondary | ICD-10-CM | POA: Insufficient documentation

## 2023-07-25 NOTE — Progress Notes (Signed)
   Acute Office Visit  Subjective:     Patient ID: Lisa Calhoun, female    DOB: [birth year], 22 y.o.   MRN: [id number]  Chief Complaint  Patient presents with   Menstrual Problem    HPI Patient is in today for irregular  menstrual cycle.  Patient is currently sexually active with 1 female partner.  Does not use condoms or other birth control.  Patient concerned that she finished her menstruation 2 weeks ago, then began having bleeding again.  This began last Sunday, approximately 5 days ago.  Patient's menstruation  typically last 1 week.  Symptoms currently are similar to those symptoms of typical menses including cramping.  She has had a regular periods in the past but typically these are periods that are longer than expected in between episodes.  Sometimes she will go multiple months without a period.  We discussed birth control options.  Discussed condoms, IUD, Nexplanon, oral contraceptives.  Patient declines at this time.  Recommend patient talk about further with her PCP at their visit next month.  ROS      Objective:    BP 118/83   Pulse 69   Ht 5\' 2"  (1.575 m)   Wt 149 lb (67.6 kg)   SpO2 97%   BMI 27.25 kg/m    Physical Exam General: Alert, oriented Pulmonary: No respiratory distress Psych: Pleasant affect.  No results found for any visits on 07/25/23.      Assessment & Plan:   Irregular menses Assessment & Plan: Patient having uterine bleeding and cramping consistent with her typical menstrual periods 2 weeks after her most recent one.  Has a history of irregular periods.  Is sexually active with female partner not on birth control.  Took home pregnancy test that was negative.  Will confirm with beta hCG quantitative testing today.  Orders: -     Beta hCG quant (ref lab)  General counseling and advice for contraceptive management Assessment & Plan: Patient currently sexually active without using contraception.  Discussed condoms, oral contraceptives,  IUD, Nexplanon.  Recommend patient be on some form for control.  Patient declined at this time.  Advised her to discuss this with her PCP at their follow-up 1 month from now.      No follow-ups on file.  Sandre Kitty, MD

## 2023-07-25 NOTE — Patient Instructions (Addendum)
It was nice to see you today,  We addressed the following topics today: -I will get a test called a quantitative hCG test to rule out pregnancy. - It is most likely that you are just having irregular menses. - I would strongly suggest that if you are sexually active you use some kind of birth control.  Condoms are the least effective but are also the only ones that protect against STIs..  Other options include oral contraceptives, the Nexplanon device that goes in the arm, or IUDs. - Please discuss this with Lequita Halt when you see her in 1 month.  Have a great day,  Frederic Jericho, MD

## 2023-07-25 NOTE — Assessment & Plan Note (Signed)
Patient currently sexually active without using contraception.  Discussed condoms, oral contraceptives, IUD, Nexplanon.  Recommend patient be on some form for control.  Patient declined at this time.  Advised her to discuss this with her PCP at their follow-up 1 month from now.

## 2023-07-25 NOTE — Assessment & Plan Note (Signed)
Patient having uterine bleeding and cramping consistent with her typical menstrual periods 2 weeks after her most recent one.  Has a history of irregular periods.  Is sexually active with female partner not on birth control.  Took home pregnancy test that was negative.  Will confirm with beta hCG quantitative testing today.

## 2023-07-26 LAB — BETA HCG QUANT (REF LAB): hCG Quant: <1 m[IU]/mL

## 2023-07-27 ENCOUNTER — Encounter: Payer: Self-pay | Admitting: Family Medicine

## 2023-08-08 ENCOUNTER — Other Ambulatory Visit: Payer: Self-pay

## 2023-08-08 ENCOUNTER — Ambulatory Visit
Admission: EM | Admit: 2023-08-08 | Discharge: 2023-08-08 | Disposition: A | Discharge status: Home / Self Care | Payer: Medicaid Other | Attending: Internal Medicine | Admitting: Internal Medicine

## 2023-08-08 DIAGNOSIS — Z3202 Encounter for pregnancy test, result negative: Secondary | ICD-10-CM

## 2023-08-08 DIAGNOSIS — B349 Viral infection, unspecified: Secondary | ICD-10-CM | POA: Diagnosis not present

## 2023-08-08 DIAGNOSIS — R112 Nausea with vomiting, unspecified: Secondary | ICD-10-CM | POA: Diagnosis not present

## 2023-08-08 LAB — POCT URINE PREGNANCY: Preg Test, Ur: NEGATIVE

## 2023-08-08 NOTE — Discharge Instructions (Signed)
Urine pregnancy test negative.  Suspect that you have a viral illness that should run its course. Ensure adequate fluids, rest, bland diet.  Follow-up if any symptoms persist or worsen.

## 2023-08-08 NOTE — ED Triage Notes (Signed)
Patient states she need a doctors note for Wednesday and Thursday, she was vomiting Wednesday morning and stomach pains. Currently having nausea. Treated with Ibuprofen and Midol.

## 2023-08-08 NOTE — ED Provider Notes (Signed)
[facility]    CSN: [id number] Arrival date & time: 08/08/23  1328      History   Chief Complaint No chief complaint on file.   HPI Lisa Calhoun is a 22 y.o. female.   Patient presents with nausea, vomiting, stomach pain that started about 2-3 days ago.  Patient reports symptoms are improving and she is able to tolerate food and fluids.  She states that she needs a work note.  Denies blood in emesis or any associated diarrhea.  Has taken ibuprofen for symptoms.  Reports known sick contact with similar symptoms.  Last menstrual cycle was on 07/21/2023 but patient reports that she had 2 menstrual cycles that month.  States that she was evaluated for this by PCP. She is urinating appropriately.      Past Medical History:  Diagnosis Date   Asthma    Attention deficit disorder     Patient Active Problem List   Diagnosis Date Noted   Irregular menses 07/25/2023   General counseling and advice for contraceptive management 07/25/2023   ADHD (attention deficit hyperactivity disorder), combined type 11/25/2022   Mild intermittent asthma without complication 02/24/2022   Eczema 12/25/2015    History reviewed. No pertinent surgical history.  OB History   No obstetric history on file.      Home Medications    Prior to Admission medications   Medication Sig Start Date End Date Taking? Authorizing Provider  Albuterol-Budesonide (AIRSUPRA) 90-80 MCG/ACT AERO Inhale 2 puffs into the lungs every 6 (six) hours as needed. Rescue inhaler for asthma. 07/01/23   Melida Quitter, PA  fluticasone (FLOVENT HFA) 44 MCG/ACT inhaler Inhale 2 puffs into the lungs 2 (two) times daily. 07/04/23 10/02/23  Melida Quitter, PA  methylphenidate (RITALIN LA) 10 MG 24 hr capsule Take 1 capsule (10 mg total) by mouth every morning. 06/27/23   Melida Quitter, PA  methylphenidate (RITALIN LA) 10 MG 24 hr capsule Take 1 capsule (10 mg total) by mouth every morning. 07/27/23 08/26/23   Melida Quitter, PA  methylphenidate (RITALIN LA) 10 MG 24 hr capsule Take 1 capsule (10 mg total) by mouth every morning. 08/26/23 09/25/23  Saralyn Pilar A, PA  montelukast (SINGULAIR) 10 MG tablet Take 1 tablet (10 mg total) by mouth at bedtime. 06/27/23   Melida Quitter, PA  Spacer/Aero-Holding Chambers (PRO COMFORT SPACER ADULT) MISC To use with rescue inhaler prn 12/25/22   Carlean Jews, NP  VENTOLIN HFA 108 (90 Base) MCG/ACT inhaler INHALE 2 PUFFS INTO THE LUNGS EVERY 6 HOURS AS NEEDED FOR WHEEZING OR SHORTNESS OF BREATH 06/19/23   Melida Quitter, PA    Family History Family History  Problem Relation Age of Onset   Diabetes Maternal Grandmother    Diabetes Maternal Grandfather     Social History Social History   Tobacco Use   Smoking status: Never    Passive exposure: Never   Smokeless tobacco: Never  Substance Use Topics   Alcohol use: No   Drug use: Never     Allergies   Sulfamethoxazole-trimethoprim   Review of Systems Review of Systems Per HPI  Physical Exam Triage Vital Signs ED Triage Vitals  Encounter Vitals Group     BP 08/08/23 1415 123/83     Systolic BP Percentile --      Diastolic BP Percentile --      Pulse Rate 08/08/23 1415 73     Resp --      Temp  08/08/23 1415 98.4 F (36.9 C)     Temp Source 08/08/23 1415 Oral     SpO2 08/08/23 1415 99 %     Weight 08/08/23 1414 149 lb (67.6 kg)     Height 08/08/23 1414 5\' 2"  (1.575 m)     Head Circumference --      Peak Flow --      Pain Score 08/08/23 1414 7     Pain Loc --      Pain Education --      Exclude from Growth Chart --    No data found.  Updated Vital Signs BP 123/83 (BP Location: Left Arm)   Pulse 73   Temp 98.4 F (36.9 C) (Oral)   Ht 5\' 2"  (1.575 m)   Wt 149 lb (67.6 kg)   LMP 07/21/2023   SpO2 99%   BMI 27.25 kg/m   Visual Acuity Right Eye Distance:   Left Eye Distance:   Bilateral Distance:    Right Eye Near:   Left Eye Near:    Bilateral Near:      Physical Exam Constitutional:      General: She is not in acute distress.    Appearance: Normal appearance. She is not toxic-appearing or diaphoretic.  HENT:     Head: Normocephalic and atraumatic.     Mouth/Throat:     Mouth: Mucous membranes are moist.     Pharynx: No posterior oropharyngeal erythema.  Eyes:     Extraocular Movements: Extraocular movements intact.     Conjunctiva/sclera: Conjunctivae normal.  Cardiovascular:     Rate and Rhythm: Normal rate and regular rhythm.     Pulses: Normal pulses.     Heart sounds: Normal heart sounds.  Pulmonary:     Effort: Pulmonary effort is normal. No respiratory distress.     Breath sounds: Normal breath sounds.  Abdominal:     General: Bowel sounds are normal. There is no distension.     Palpations: Abdomen is soft.     Tenderness: There is no abdominal tenderness.  Neurological:     General: No focal deficit present.     Mental Status: She is alert and oriented to person, place, and time. Mental status is at baseline.  Psychiatric:        Mood and Affect: Mood normal.        Behavior: Behavior normal.        Thought Content: Thought content normal.        Judgment: Judgment normal.      UC Treatments / Results  Labs (all labs ordered are listed, but only abnormal results are displayed) Labs Reviewed  POCT URINE PREGNANCY    EKG   Radiology No results found.  Procedures Procedures (including critical care time)  Medications Ordered in UC Medications - No data to display  Initial Impression / Assessment and Plan / UC Course  I have reviewed the triage vital signs and the nursing notes.  Pertinent labs & imaging results that were available during my care of the patient were reviewed by me and considered in my medical decision making (see chart for details).     Suspect viral cause to symptoms given known sick contact.  Urine pregnancy test negative.  There are no signs of acute abdomen or dehydration on  exam and patient reports symptoms are improving.  Patient offered nausea medication but declined.  Advised adequate fluids, rest, bland diet.  Advised strict return and ER precautions.  Patient verbalized understanding  and was agreeable with plan. Final Clinical Impressions(s) / UC Diagnoses   Final diagnoses:  Viral illness  Nausea and vomiting, unspecified vomiting type  Urine pregnancy test negative     Discharge Instructions      Urine pregnancy test negative.  Suspect that you have a viral illness that should run its course. Ensure adequate fluids, rest, bland diet.  Follow-up if any symptoms persist or worsen.    ED Prescriptions   None    PDMP not reviewed this encounter.   Gustavus Bryant, [place] 08/08/23 [phone]

## 2023-08-27 ENCOUNTER — Ambulatory Visit: Payer: Medicaid Other | Admitting: Family Medicine

## 2023-09-29 ENCOUNTER — Other Ambulatory Visit: Payer: Self-pay

## 2023-09-29 ENCOUNTER — Ambulatory Visit
Admission: EM | Admit: 2023-09-29 | Discharge: 2023-09-29 | Disposition: A | Discharge status: Home / Self Care | Payer: Self-pay | Attending: Family Medicine | Admitting: Family Medicine

## 2023-09-29 ENCOUNTER — Encounter: Payer: Self-pay | Admitting: *Deleted

## 2023-09-29 DIAGNOSIS — J4521 Mild intermittent asthma with (acute) exacerbation: Secondary | ICD-10-CM

## 2023-09-29 MED ORDER — IPRATROPIUM-ALBUTEROL 0.5-2.5 (3) MG/3ML IN SOLN
3.0000 mL | Freq: Once | RESPIRATORY_TRACT | Status: AC
  Administered 2023-09-29: 3 mL via RESPIRATORY_TRACT

## 2023-09-29 MED ORDER — PREDNISONE 20 MG PO TABS: 20.0000 mg | ORAL_TABLET | Freq: Every day | ORAL | 0 refills | Status: AC

## 2023-09-29 MED ORDER — IPRATROPIUM-ALBUTEROL 0.5-2.5 (3) MG/3ML IN SOLN: 3.0000 mL | Freq: Four times a day (QID) | RESPIRATORY_TRACT | 0 refills | Status: DC | PRN

## 2023-09-29 NOTE — ED Provider Notes (Signed)
 [facility]    CSN: [id number] Arrival date & time: 09/29/23  1406      History   Chief Complaint Chief Complaint  Patient presents with   Wheezing    HPI Lisa Calhoun is a 23 y.o. female.   Patient presents today for evaluation of 3 days of worsening asthma exacerbation.  She reports she has had a wheeze and cough which has not improved with use of her albuterol  inhaler.  She has a nebulizer machine however does not have the solution to use in the machine.  She denies any recent hospitalization or exacerbation to extent in quite some time.  Patient is prescribed Flovent  although reports that she does not consistently use to prevent asthma exacerbations. Past Medical History:  Diagnosis Date   Asthma    Attention deficit disorder     Patient Active Problem List   Diagnosis Date Noted   Irregular menses 07/25/2023   General counseling and advice for contraceptive management 07/25/2023   ADHD (attention deficit hyperactivity disorder), combined type 11/25/2022   Mild intermittent asthma without complication 02/24/2022   Eczema 12/25/2015    No past surgical history on file.  OB History   No obstetric history on file.      Home Medications    Prior to Admission medications   Medication Sig Start Date End Date Taking? Authorizing Provider  Albuterol -Budesonide (AIRSUPRA ) 90-80 MCG/ACT AERO Inhale 2 puffs into the lungs every 6 (six) hours as needed. Rescue inhaler for asthma. 07/01/23  Yes Wallace Search A, PA  ipratropium-albuterol  (DUONEB) 0.5-2.5 (3) MG/3ML SOLN Take 3 mLs by nebulization every 6 (six) hours as needed. 09/29/23  Yes Arloa Suzen RAMAN, NP  methylphenidate  (RITALIN  LA) 10 MG 24 hr capsule Take 1 capsule (10 mg total) by mouth every morning. 06/27/23  Yes Wallace Search A, PA  montelukast  (SINGULAIR ) 10 MG tablet Take 1 tablet (10 mg total) by mouth at bedtime. 06/27/23  Yes Wallace Search A, PA  predniSONE  (DELTASONE ) 20 MG tablet Take 1  tablet (20 mg total) by mouth daily with breakfast for 5 days. 09/29/23 10/04/23 Yes Arloa Suzen RAMAN, NP  fluticasone  (FLOVENT  HFA) 44 MCG/ACT inhaler Inhale 2 puffs into the lungs 2 (two) times daily. Patient not taking: Reported on 09/29/2023 07/04/23 10/02/23  Wallace Search A, PA  methylphenidate  (RITALIN  LA) 10 MG 24 hr capsule Take 1 capsule (10 mg total) by mouth every morning. 07/27/23 08/26/23  Wallace Search LABOR, PA  methylphenidate  (RITALIN  LA) 10 MG 24 hr capsule Take 1 capsule (10 mg total) by mouth every morning. 08/26/23 09/25/23  Wallace Search LABOR, PA  Spacer/Aero-Holding Chambers (PRO COMFORT SPACER ADULT) MISC To use with rescue inhaler prn Patient not taking: Reported on 09/29/2023 12/25/22   Hanford Powell BRAVO, NP  VENTOLIN  HFA 108 (90 Base) MCG/ACT inhaler INHALE 2 PUFFS INTO THE LUNGS EVERY 6 HOURS AS NEEDED FOR WHEEZING OR SHORTNESS OF BREATH Patient not taking: Reported on 09/29/2023 06/19/23   Wallace Search LABOR, PA    Family History Family History  Problem Relation Age of Onset   Diabetes Maternal Grandmother    Diabetes Maternal Grandfather     Social History Social History   Tobacco Use   Smoking status: Never    Passive exposure: Never   Smokeless tobacco: Never  Vaping Use   Vaping status: Some Days  Substance Use Topics   Alcohol use: No   Drug use: Never     Allergies   Sulfamethoxazole-trimethoprim   Review  of Systems Review of Systems  Respiratory:  Positive for wheezing.      Physical Exam Triage Vital Signs ED Triage Vitals  Encounter Vitals Group     BP 09/29/23 1439 118/81     Systolic BP Percentile --      Diastolic BP Percentile --      Pulse Rate 09/29/23 1439 77     Resp 09/29/23 1439 20     Temp 09/29/23 1439 98.5 F (36.9 C)     Temp Source 09/29/23 1439 Oral     SpO2 09/29/23 1439 96 %     Weight --      Height --      Head Circumference --      Peak Flow --      Pain Score 09/29/23 1435 0     Pain Loc --      Pain Education --       Exclude from Growth Chart --    No data found.  Updated Vital Signs BP 118/81 (BP Location: Left Arm)   Pulse 77   Temp 98.5 F (36.9 C) (Oral)   Resp 20   LMP 09/17/2023 (Approximate)   SpO2 96%   Visual Acuity Right Eye Distance:   Left Eye Distance:   Bilateral Distance:    Right Eye Near:   Left Eye Near:    Bilateral Near:     Physical Exam Vitals reviewed.  Constitutional:      Appearance: Normal appearance.  HENT:     Head: Normocephalic and atraumatic.     Nose: Nose normal.  Eyes:     Extraocular Movements: Extraocular movements intact.     Pupils: Pupils are equal, round, and reactive to light.  Cardiovascular:     Rate and Rhythm: Normal rate and regular rhythm.  Pulmonary:     Effort: Pulmonary effort is normal.     Breath sounds: Decreased air movement present. Wheezing present.  Musculoskeletal:        General: Normal range of motion.     Cervical back: Normal range of motion.  Skin:    General: Skin is warm and dry.  Neurological:     General: No focal deficit present.     Mental Status: She is alert and oriented to person, place, and time.      UC Treatments / Results  Labs (all labs ordered are listed, but only abnormal results are displayed) Labs Reviewed - No data to display  EKG   Radiology No results found.  Procedures Procedures (including critical care time)  Medications Ordered in UC Medications  ipratropium-albuterol  (DUONEB) 0.5-2.5 (3) MG/3ML nebulizer solution 3 mL (3 mLs Nebulization Given 09/29/23 1458)    Initial Impression / Assessment and Plan / UC Course  I have reviewed the triage vital signs and the nursing notes.  Pertinent labs & imaging results that were available during my care of the patient were reviewed by me and considered in my medical decision making (see chart for details).   Asthma with acute exacerbation, prednisone  20 mg once daily for 5 days.  Patient given a DuoNeb treatment here in clinic.   Patient has a nebulizer machine at home without solution, prescribed DuoNeb for home use every 6 hours as needed.  Patient encouraged to resume use of fluticasone  inhaler for prevention of asthma flare.  Follow-up with PCP as needed. Final Clinical Impressions(s) / UC Diagnoses   Final diagnoses:  Intermittent asthma with acute exacerbation, unspecified asthma severity  Discharge Instructions      I have prescribed you albuterol -Pratropium to use in your nebulizer machine these treatments can be given every 6 hours as needed for wheezing, chest tightness and shortness of breath.  Prednisone  20 mg once daily for 5 days has been prescribed for current asthma exacerbation.     ED Prescriptions     Medication Sig Dispense Auth. Provider   predniSONE  (DELTASONE ) 20 MG tablet Take 1 tablet (20 mg total) by mouth daily with breakfast for 5 days. 5 tablet Davarion Cuffee S, NP   ipratropium-albuterol  (DUONEB) 0.5-2.5 (3) MG/3ML SOLN Take 3 mLs by nebulization every 6 (six) hours as needed. 90 mL Arloa Suzen RAMAN, NP      PDMP not reviewed this encounter.   Arloa Suzen RAMAN, NP 09/29/23 1500

## 2023-09-29 NOTE — ED Triage Notes (Signed)
 Pt reports hx of asthma. States wheezing x 3 days. Using inhaler without relief. Cough x 3 days productive for mucous

## 2023-09-29 NOTE — Discharge Instructions (Signed)
 I have prescribed you albuterol -Pratropium to use in your nebulizer machine these treatments can be given every 6 hours as needed for wheezing, chest tightness and shortness of breath.  Prednisone  20 mg once daily for 5 days has been prescribed for current asthma exacerbation.

## 2023-10-03 ENCOUNTER — Encounter: Payer: Medicaid Other | Admitting: Family Medicine

## 2023-10-30 ENCOUNTER — Encounter: Payer: Self-pay | Admitting: Family Medicine

## 2024-04-09 ENCOUNTER — Encounter: Payer: Self-pay | Admitting: Family Medicine

## 2024-04-09 ENCOUNTER — Ambulatory Visit: Admitting: Family Medicine

## 2024-04-09 VITALS — BP 116/83 | Ht 62.0 in | Wt 161.0 lb

## 2024-04-09 DIAGNOSIS — Z0000 Encounter for general adult medical examination without abnormal findings: Secondary | ICD-10-CM

## 2024-04-09 DIAGNOSIS — F902 Attention-deficit hyperactivity disorder, combined type: Secondary | ICD-10-CM

## 2024-04-09 DIAGNOSIS — J4520 Mild intermittent asthma, uncomplicated: Secondary | ICD-10-CM | POA: Diagnosis not present

## 2024-04-09 DIAGNOSIS — J4531 Mild persistent asthma with (acute) exacerbation: Secondary | ICD-10-CM

## 2024-04-09 DIAGNOSIS — R062 Wheezing: Secondary | ICD-10-CM

## 2024-04-09 MED ORDER — AIRSUPRA 90-80 MCG/ACT IN AERO: 2.0000 | INHALATION_SPRAY | Freq: Four times a day (QID) | RESPIRATORY_TRACT | 11 refills | Status: DC | PRN

## 2024-04-09 MED ORDER — METHYLPHENIDATE HCL ER (LA) 20 MG PO CP24: 20.0000 mg | ORAL_CAPSULE | ORAL | 0 refills | Status: DC

## 2024-04-09 NOTE — Assessment & Plan Note (Signed)
 History of asthma, previously using AirSupra  and Flovent . Recently used an over-the-counter inhaler due to insurance issues. Reports AirSupra  is more effective than Ventolin . Lungs are clear on exam. - Prescribe AirSupra  inhaler. - Prescribe Flovent  inhaler. - Send prescriptions to [facility]. Counseled that with Medicaid, co-pay should be low.

## 2024-04-09 NOTE — Progress Notes (Signed)
 Established Patient Office Visit  Subjective   Patient ID: Lisa Calhoun, female    DOB: 08-13-2001  Age: 23 y.o. MRN: [id number]  No chief complaint on file.   HPI  Subjective - Asthma: Reports asthma has improved. Ran out of AirSupra  due to an insurance issue and was using an over-the-counter inhaler from [facility]. Confirms AirSupra  works better than Ventolin . Also uses a daily inhaler, Flovent , two times a day and needs a refill. - Allergies: Does not take any allergy medication. Does not recall taking prescribed montelukast . Denies taking over-the-counter medications like Claritin or Zyrtec. - General checkup: No other specific issues or concerns. - ADHD: Reports current dose of ADHD medication is not helping with concentration and requests an increase in dose. - Menstrual periods: Reports irregular periods. Last menses was two months ago and is currently menstruating with significant cramping. - Gynecological Health: Declines STI testing. Denies vaginal itching or discharge. Declines a Pap smear.  Medications AirSupra  inhaler, as needed for asthma. Flovent  inhaler, used twice daily for asthma. ADHD medication, previously 10 mg.  PMH, PSH, FH, Social Hx PMHx: Asthma, ADHD. PSH: None mentioned. FH: No family history of diabetes, high cholesterol, or heart disease was reported. Family members wear glasses. 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 Plans to attend dental school in the future. Lives with boyfriend and his parents. Uses contraception but not hormonal birth control or an IUD.  ROS CONSTITUTIONAL: Denies constitutional symptoms. 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 GYN: Irregular menses, dysmenorrhea. Denies vaginal discharge or itching.    The ASCVD Risk score (Arnett DK, et al., 2019) failed to calculate for the following reasons:   The 2019 ASCVD risk score is only valid for ages 53 to  77  Health Maintenance Due  Topic Date Due   CHLAMYDIA SCREENING  Never done   HIV Screening  Never done   Meningococcal B Vaccine (1 of 2 - Standard) Never done   Hepatitis C Screening  Never done   Pneumococcal Vaccine 13-47 Years old (1 of 2 - PCV) 12/20/2021   DTaP/Tdap/Td (7 - Td or Tdap) 04/27/2023   COVID-19 Vaccine (1 - 2024-25 season) Never done   Cervical Cancer Screening (Pap smear)  Never done      Objective:     BP 116/83   Ht 5' 2 (1.575 m)   Wt 161 lb (73 kg)   LMP 04/09/2024   SpO2 98%   BMI 29.45 kg/m    Physical Exam Gen: alert, oriented HEENT: perrla, eomi.  Left posterior iris border appears slightly concave Cv: rrr Pulm: lctab Msk: normal gait Ext: no pedal edema Psych: pleasant affect   No results found for any visits on 04/09/24.      Assessment & Plan:   Physical exam, annual  Mild intermittent asthma without complication Assessment & Plan: History of asthma, previously using AirSupra  and Flovent . Recently used an over-the-counter inhaler due to insurance issues. Reports AirSupra  is more effective than Ventolin . Lungs are clear on exam. - Prescribe AirSupra  inhaler. - Prescribe Flovent  inhaler. - [facility] Counseled that with Medicaid, co-pay should be low.  Orders: -     Airsupra ; Inhale 2 puffs into the lungs every 6 (six) hours as needed. Rescue inhaler for asthma.  Dispense: 1 g; Refill: 11  Acute severe exacerbation of mild persistent asthma -     Airsupra ; Inhale 2 puffs into the lungs every  6 (six) hours as needed. Rescue inhaler for asthma.  Dispense: 1 g; Refill: 11  Wheezing -     Airsupra ; Inhale 2 puffs into the lungs every 6 (six) hours as needed. Rescue inhaler for asthma.  Dispense: 1 g; Refill: 11  ADHD (attention deficit hyperactivity disorder), combined type Assessment & Plan: Reports current 10mg  dose of ADHD medication is not effective for concentration. - Increase ADHD medication to the  next highest dose. - [facility]   Healthcare maintenance Assessment & Plan: Declines Pap smear.  Declines STI testing.   Other orders -     Methylphenidate  HCl ER (LA); Take 1 capsule (20 mg total) by mouth every morning.  Dispense: 30 capsule; Refill: 0 -     Methylphenidate  HCl ER (LA); Take 1 capsule (20 mg total) by mouth every morning.  Dispense: 30 capsule; Refill: 0 -     Methylphenidate  HCl ER (LA); Take 1 capsule (20 mg total) by mouth every morning.  Dispense: 30 capsule; Refill: 0     Return in about 3 months (around 07/10/2024) for adhd, asthma.    Toribio MARLA Slain, MD

## 2024-04-09 NOTE — Patient Instructions (Signed)
 It was nice to see you today,  We addressed the following topics today: -I am sending in the Ritalin  and the [facility].  Having issues with this let us  know - You can follow-up with me in 3 months.  Have a great day,  Rolan Slain, MD

## 2024-04-09 NOTE — Assessment & Plan Note (Signed)
 Declines Pap smear.  Declines STI testing.

## 2024-04-09 NOTE — Assessment & Plan Note (Signed)
 Reports current 10mg  dose of ADHD medication is not effective for concentration. - Increase ADHD medication to the next highest dose. - [facility]

## 2024-07-16 ENCOUNTER — Ambulatory Visit

## 2024-07-16 VITALS — BP 116/84 | HR 77 | Temp 98.4°F | Ht 62.0 in | Wt 168.0 lb

## 2024-07-16 DIAGNOSIS — J4520 Mild intermittent asthma, uncomplicated: Secondary | ICD-10-CM

## 2024-07-16 DIAGNOSIS — F902 Attention-deficit hyperactivity disorder, combined type: Secondary | ICD-10-CM | POA: Diagnosis not present

## 2024-07-16 MED ORDER — AMPHETAMINE-DEXTROAMPHETAMINE 5 MG PO TABS: 5.0000 mg | ORAL_TABLET | Freq: Every day | ORAL | 0 refills | Status: DC

## 2024-07-16 MED ORDER — METHYLPHENIDATE HCL ER (LA) 20 MG PO CP24: 20.0000 mg | ORAL_CAPSULE | ORAL | 0 refills | Status: DC

## 2024-07-16 NOTE — Patient Instructions (Signed)
 It was nice to see you today!  As we discussed in clinic:  - Continue your Ritalin  XR daily as prescribed  - We have added a 5 mg dose of immediate - release Adderall for you to take as needed in the afternoons when you feel that the Ritalin  has worn off.   We will see you back in 3 months for a follow up!  If you have any problems before your next visit feel free to message me via MyChart (minor issues or questions) or call the office, otherwise you may reach out to schedule an office visit.  Thank you! Saddie Sacks, PA-C

## 2024-07-19 NOTE — Assessment & Plan Note (Signed)
 Doing well with Airsupra  and Flovent . Continue these medications and Montelukast  10 mg daily. Will cont to monitor.

## 2024-07-19 NOTE — Assessment & Plan Note (Signed)
 Doing well on Ritalin  XR 20 mg daily. Will add Adderall 5 mg for afternoon crashes as needed. Her insurance preferred Adderal IR > Ritalin  IR which is the reason for differing therapies. PDMP reviewed, no aberrancies. Will follow up again in 3 months to assess med changes.

## 2024-07-19 NOTE — Progress Notes (Signed)
 Established Patient Office Visit  Subjective   Patient ID: Lisa Calhoun, female    DOB: [birth year]  Age: 23 y.o. MRN: [id number]  Chief Complaint  Patient presents with   Medical Management of Chronic Issues    HPI  Lisa Calhoun is a 23 y.o. female who presents to the clinic for follow up on asthma and ADHD.   Asthma: - Patient was started on AirSupra  at her last OV due to worsening asthma symptoms. Today she reports that her symptoms have improved greatly with the addition of AirSupra . She denies sides effects. Reports she is using is daily for maintenance but is not reaching for an inhaler for rescue very often.   ADHD: - Patient with history of ADHD being treated with Ritalin  20 mg XR once daily. She is doing well on this medication and denies side effects, however does feel that the medication is wearing off around 2-4pm described as an afternoon crash. Patient is interested in discussing IR options for as needed use to help her get through her work/school day.    ROS Per HPI.    Objective:     BP 116/84   Pulse 77   Temp 98.4 F (36.9 C) (Oral)   Ht 5' 2 (1.575 m)   Wt 168 lb (76.2 kg)   SpO2 98%   BMI 30.73 kg/m    Physical Exam Constitutional:      General: She is not in acute distress.    Appearance: Normal appearance.  Cardiovascular:     Rate and Rhythm: Normal rate and regular rhythm.     Heart sounds: Normal heart sounds. No murmur heard.    No friction rub. No gallop.  Pulmonary:     Effort: Pulmonary effort is normal. No respiratory distress.     Breath sounds: Normal breath sounds.  Musculoskeletal:        General: No swelling.     Cervical back: Neck supple.  Lymphadenopathy:     Cervical: No cervical adenopathy.  Skin:    General: Skin is warm and dry.  Neurological:     General: No focal deficit present.     Mental Status: She is alert.  Psychiatric:        Mood and Affect: Mood normal.        Behavior: Behavior normal.         Thought Content: Thought content normal.      No results found for any visits on 07/16/24.    The ASCVD Risk score (Arnett DK, et al., 2019) failed to calculate for the following reasons:   The 2019 ASCVD risk score is only valid for ages 39 to 83    Assessment & Plan:   ADHD (attention deficit hyperactivity disorder), combined type Assessment & Plan: Doing well on Ritalin  XR 20 mg daily. Will add Adderall 5 mg for afternoon crashes as needed. Her insurance preferred Adderal IR > Ritalin  IR which is the reason for differing therapies. PDMP reviewed, no aberrancies. Will follow up again in 3 months to assess med changes.  Orders: -     Methylphenidate  HCl ER (LA); Take 1 capsule (20 mg total) by mouth every morning.  Dispense: 30 capsule; Refill: 0 -     Amphetamine-Dextroamphetamine; Take 1 tablet (5 mg total) by mouth daily.  Dispense: 30 tablet; Refill: 0  Mild intermittent asthma without complication Assessment & Plan: Doing well with Airsupra  and Flovent . Continue these medications and Montelukast  10 mg daily. Will cont to monitor.  Return in about 3 months (around 10/16/2024) for ADHD.    Saddie JULIANNA Sacks, PA-C

## 2024-08-17 ENCOUNTER — Other Ambulatory Visit: Payer: Self-pay | Admitting: Family Medicine

## 2024-08-17 DIAGNOSIS — J4520 Mild intermittent asthma, uncomplicated: Secondary | ICD-10-CM

## 2024-08-17 DIAGNOSIS — R062 Wheezing: Secondary | ICD-10-CM

## 2024-08-17 DIAGNOSIS — J4531 Mild persistent asthma with (acute) exacerbation: Secondary | ICD-10-CM

## 2024-09-08 ENCOUNTER — Other Ambulatory Visit: Payer: Self-pay

## 2024-09-08 DIAGNOSIS — F902 Attention-deficit hyperactivity disorder, combined type: Secondary | ICD-10-CM

## 2024-10-15 ENCOUNTER — Ambulatory Visit

## 2024-10-15 DIAGNOSIS — F902 Attention-deficit hyperactivity disorder, combined type: Secondary | ICD-10-CM

## 2024-10-15 MED ORDER — METHYLPHENIDATE HCL ER (LA) 20 MG PO CP24: 20.0000 mg | ORAL_CAPSULE | ORAL | 0 refills | Status: AC

## 2024-10-15 MED ORDER — AMPHETAMINE-DEXTROAMPHETAMINE 5 MG PO TABS: 1.0000 | ORAL_TABLET | Freq: Every day | ORAL | 0 refills | Status: AC

## 2024-10-15 NOTE — Patient Instructions (Signed)
 VISIT SUMMARY: During your visit, we discussed your ADHD management, medication adherence, occupational status, and educational plans. Your ADHD is well-managed with Adderall, and you have no issues with medication access.  YOUR PLAN: ATTENTION-DEFICIT HYPERACTIVITY DISORDER (ADHD): Your ADHD is well-managed with Adderall, and you experienced no issues after temporarily discontinuing it. -Continue taking Adderall as prescribed. Your prescription has been refilled for three months at [facility]. -Be aware that taking breaks from the medication can enhance its effectiveness.  If you have any problems before your next visit feel free to message me via MyChart (minor issues or questions) or call the office, otherwise you may reach out to schedule an office visit.  Thank you! Saddie Sacks, PA-C

## 2024-10-16 NOTE — Progress Notes (Signed)
 "  Established Patient Office Visit  Subjective   Patient ID: Lisa Calhoun, female    DOB: 2001-05-06  Age: 24 y.o. MRN: [id number]  Chief Complaint  Patient presents with   Medication Management    HPI   History of Present Illness   Lisa Calhoun is a 24 year old female who presents for medication refills and routine follow-up.  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    Objective:     BP 121/84   Pulse 65   Temp 98.8 F (37.1 C) (Oral)   Ht 5' 2 (1.575 m)   Wt 155 lb (70.3 kg)   LMP 10/08/2024   SpO2 97%   BMI 28.35 kg/m    Physical Exam Constitutional:      General: She is not in acute distress.    Appearance: Normal appearance.  Cardiovascular:     Rate and Rhythm: Normal rate and regular rhythm.     Heart sounds: Normal heart sounds. No murmur heard.    No friction rub. No gallop.  Pulmonary:     Effort: Pulmonary effort is normal. No respiratory distress.     Breath sounds: Normal breath sounds.  Abdominal:     General: Bowel sounds are normal.  Musculoskeletal:        General: No swelling.     Cervical back: Neck supple.  Lymphadenopathy:     Cervical: No cervical adenopathy.  Skin:    General: Skin is warm and dry.  Neurological:     General: No focal deficit present.     Mental Status: She is alert.  Psychiatric:         Mood and Affect: Mood normal.        Behavior: Behavior normal.        Thought Content: Thought content normal.      No results found for any visits on 10/15/24.    The ASCVD Risk score (Arnett DK, et al., 2019) failed to calculate for the following reasons:   The 2019 ASCVD risk score is only valid for ages 79 to 76   * - Cholesterol units were assumed    Assessment & Plan:   ADHD (attention deficit hyperactivity disorder), combined type Assessment & Plan: Doing well on Ritalin  XR 20 mg daily and Adderall 5 mg in afternoon for crashes. Her insurance preferred Adderal IR > Ritalin  IR which is the reason for differing therapies. PDMP reviewed, no aberrancies. Will follow up again in 3 months to assess med changes.   Orders: -     Amphetamine -Dextroamphetamine ; Take 1 tablet (5 mg total) by mouth daily.  Dispense: 30 tablet; Refill: 0 -     Methylphenidate  HCl ER (LA); Take 1 capsule (20 mg total) by mouth every morning.  Dispense: 30 capsule; Refill: 0 -     Amphetamine -Dextroamphetamine ; Take 1 tablet (5 mg total) by mouth daily.  Dispense: 30 tablet; Refill: 0 -  Amphetamine -Dextroamphetamine ; Take 1 tablet (5 mg total) by mouth daily.  Dispense: 30 tablet; Refill: 0 -     Methylphenidate  HCl ER (LA); Take 1 capsule (20 mg total) by mouth every morning.  Dispense: 30 capsule; Refill: 0 -     Methylphenidate  HCl ER (LA); Take 1 capsule (20 mg total) by mouth every morning.  Dispense: 30 capsule; Refill: 0     Return in about 3 months (around 01/13/2025) for ADHD/Pap smear.    Saddie JULIANNA Sacks, PA-C "

## 2024-10-16 NOTE — Assessment & Plan Note (Signed)
 Doing well on Ritalin  XR 20 mg daily and Adderall 5 mg in afternoon for crashes. Her insurance preferred Adderal IR > Ritalin  IR which is the reason for differing therapies. PDMP reviewed, no aberrancies. Will follow up again in 3 months to assess med changes.

## 2025-01-14 ENCOUNTER — Ambulatory Visit
